# Patient Record
Sex: Male | Born: 1975 | Hispanic: No | Smoking: Current every day smoker
Health system: Southern US, Community
[De-identification: ages and names within clinical notes are randomized; demographics above are authoritative.]

## PROBLEM LIST (undated history)

## (undated) HISTORY — PX: APPENDECTOMY: SHX54

---

## 2001-02-09 ENCOUNTER — Emergency Department (HOSPITAL_COMMUNITY): Admission: EM | Admit: 2001-02-09 | Discharge: 2001-02-09 | Payer: Self-pay | Admitting: *Deleted

## 2002-07-05 ENCOUNTER — Encounter: Payer: Self-pay | Admitting: Emergency Medicine

## 2002-07-05 ENCOUNTER — Emergency Department (HOSPITAL_COMMUNITY): Admission: EM | Admit: 2002-07-05 | Discharge: 2002-07-05 | Payer: Self-pay | Admitting: Emergency Medicine

## 2003-06-29 ENCOUNTER — Emergency Department (HOSPITAL_COMMUNITY): Admission: EM | Admit: 2003-06-29 | Discharge: 2003-06-29 | Payer: Self-pay | Admitting: Emergency Medicine

## 2003-10-30 ENCOUNTER — Emergency Department: Payer: Self-pay | Admitting: Emergency Medicine

## 2004-05-22 ENCOUNTER — Encounter: Payer: Self-pay | Admitting: *Deleted

## 2004-05-23 ENCOUNTER — Emergency Department (HOSPITAL_COMMUNITY): Admission: EM | Admit: 2004-05-23 | Discharge: 2004-05-23 | Payer: Self-pay | Admitting: Emergency Medicine

## 2009-11-20 ENCOUNTER — Encounter: Payer: Self-pay | Admitting: Orthopedic Surgery

## 2009-11-20 ENCOUNTER — Emergency Department (HOSPITAL_COMMUNITY): Admission: EM | Admit: 2009-11-20 | Discharge: 2009-11-21 | Payer: Self-pay | Admitting: Emergency Medicine

## 2009-11-23 ENCOUNTER — Telehealth: Payer: Self-pay | Admitting: Orthopedic Surgery

## 2009-11-23 ENCOUNTER — Ambulatory Visit: Payer: Self-pay | Admitting: Orthopedic Surgery

## 2009-11-23 DIAGNOSIS — S82409A Unspecified fracture of shaft of unspecified fibula, initial encounter for closed fracture: Secondary | ICD-10-CM

## 2009-11-25 ENCOUNTER — Encounter: Payer: Self-pay | Admitting: Orthopedic Surgery

## 2009-11-30 ENCOUNTER — Telehealth (INDEPENDENT_AMBULATORY_CARE_PROVIDER_SITE_OTHER): Payer: Self-pay | Admitting: *Deleted

## 2009-12-18 ENCOUNTER — Telehealth: Payer: Self-pay | Admitting: Orthopedic Surgery

## 2010-01-05 ENCOUNTER — Ambulatory Visit: Payer: Self-pay | Admitting: Orthopedic Surgery

## 2010-01-06 ENCOUNTER — Encounter (INDEPENDENT_AMBULATORY_CARE_PROVIDER_SITE_OTHER): Payer: Self-pay | Admitting: *Deleted

## 2010-01-18 ENCOUNTER — Emergency Department (HOSPITAL_COMMUNITY)
Admission: EM | Admit: 2010-01-18 | Discharge: 2010-01-18 | Payer: Self-pay | Source: Home / Self Care | Admitting: Emergency Medicine

## 2010-02-16 NOTE — Progress Notes (Signed)
Summary: patient requests refill on pain medication  Phone Note Call from Patient   Caller: Patient Summary of Call: Patient stopped in to request a refill on his pain medication, Percocet.  States has 2 pills left, and that he "came in because he did not know what the process is."  His ph # is [phone].   Last note indicates 11/23/09 refilled at [facility] in [place]; confirmed this is patient's pharmacy Initial call taken by: Cammie Sickle,  November 30, 2009 1:16 PM  Follow-up for Phone Call        ok or change Follow-up by: Ether Griffins,  November 30, 2009 1:26 PM  Additional Follow-up for Phone Call Additional follow up Details #1::        change to norco 7.5 mg 1 q 4 as needed pain   # 84 NR  Additional Follow-up by: Fuller Canada MD,  November 30, 2009 3:09 PM    New/Updated Medications: NORCO 7.5-325 MG TABS (HYDROCODONE-ACETAMINOPHEN) 1 by mouth q 4 hrs as needed pain Prescriptions: NORCO 7.5-325 MG TABS (HYDROCODONE-ACETAMINOPHEN) 1 by mouth q 4 hrs as needed pain  #84 x 0   Entered by:   Ether Griffins   Authorized by:   Fuller Canada MD   Signed by:   Ether Griffins on 11/30/2009   Method used:   Historical   RxID:   [id number]

## 2010-02-16 NOTE — Progress Notes (Signed)
Summary: ok to fill Percocet?  Phone Note Other Incoming   Summary of Call: Cari/ [facility] in [place] ,said Mark Hawkins had a prescription for Hydrocodone #20 filled earlier today.  She asked if ok to fill the  prescription for Percocet today.  Per Dr. Romeo Apple, I called Cari back and told her ok to fill Percocet Initial call taken by: Jacklynn Ganong,  November 23, 2009 4:22 PM  Follow-up for Phone Call        ok to refill  Follow-up by: Fuller Canada MD,  November 23, 2009 4:24 PM

## 2010-02-16 NOTE — Letter (Signed)
Summary: History form  History form   Imported By: Jacklynn Ganong 11/25/2009 11:44:21  _____________________________________________________________________  External Attachment:    Type:   Image     Comment:   External Document

## 2010-02-16 NOTE — Assessment & Plan Note (Signed)
Summary: AP ER FOL/UP/FX RT ANKLE/XRAY [facility] 11/21/09/SELF PAY/CAF   Vital Signs:  Patient profile:   35 year old male Height:      70 inches Weight:      180 pounds Pulse rate:   82 / minute Resp:     18 per minute  Vitals Entered By: Fuller Canada MD (November 23, 2009 3:21 PM)  Visit Type:  new patient Referring Provider:  ap er Primary Provider:  NA  CC:  right ankle pain.  History of Present Illness: I saw Mark Hawkins in the office today for an initial visit.  He is a 35 years old man with the complaint of:  right ankle pain.  DOI 11/20/09 fell through camper roof.  Xrays [facility] 11/21/09 right ankle and foot.  Percocet 5 given from er, number 20 on 11/21/09.  Celexa and Buspar are other meds taken.  The patient landed on the lateral side of his leg sustaining a nondisplaced fracture of the fibula proximally. One 3rd of the way up his leg. No pain medially but he does have pain over the fracture site with a slight abrasion of the skin in a short throbbing, burning, pain, which is constant and 9/10. He was given a cam walker, which is the treatment I would have recommended anyway, so he can continue with that. I did give him 30 Percocet, to continue with.    Allergies (verified): 1)  ! Pcn  Past History:  Past Medical History: anxiety depression  Past Surgical History: none  Family History: Family History of Arthritis  Social History: Patient is separated.  self employed smokes 1ppd cigs no alcohol 3 cups of caffeine daily GED  Review of Systems Constitutional:  Denies weight loss, weight gain, fever, chills, and fatigue. Cardiovascular:  Denies chest pain, palpitations, fainting, and murmurs. Respiratory:  Denies short of breath, wheezing, couch, tightness, pain on inspiration, and snoring . Gastrointestinal:  Denies heartburn, nausea, vomiting, diarrhea, constipation, and blood in your stools. Genitourinary:  Denies frequency, urgency, difficulty  urinating, painful urination, flank pain, and bleeding in urine. Neurologic:  Denies numbness, tingling, unsteady gait, dizziness, tremors, and seizure. Musculoskeletal:  Complains of joint pain, swelling, and heat; denies instability, stiffness, redness, and muscle pain. Endocrine:  Denies excessive thirst, exessive urination, and heat or cold intolerance. Psychiatric:  Denies nervousness, depression, anxiety, and hallucinations. Skin:  Complains of redness; denies changes in the skin, poor healing, rash, and itching. HEENT:  Denies blurred or double vision, eye pain, redness, and watering. Immunology:  Denies seasonal allergies, sinus problems, and allergic to bee stings. Hemoatologic:  Denies easy bleeding and brusing.  Physical Exam  Msk:  The patient is well developed and nourished, with normal grooming and hygiene. The body habitus is  lean Pulses:  The pulses and perfusion were normal with normal color, temperature  and no swelling  Extremities:  right ankle there is tenderness over the fibula. No medial tenderness. Normal range of motion knee and ankle. Muscle tone normal. Ankle stable. Neurologic:  The coordination and sensation were normal     Skin:  abrasion lateral right leg  Psych:  alert and cooperative; normal mood and affect; normal attention span and concentration   Impression & Recommendations:  Problem # 1:  CLOSED FRACTURE OF SHAFT OF FIBULA (ICD-823.21) Assessment New  Orders: New Patient Level III ([address])  Medications Added to Medication List This Visit: 1)  Endocet 5-325 Mg Tabs (Oxycodone-acetaminophen) .Marland Kitchen.. 1 q 4 as needed pain  Patient  Instructions: 1)  Xrays in 6 weeks right  ankle  Prescriptions: ENDOCET 5-325 MG TABS (OXYCODONE-ACETAMINOPHEN) 1 q 4 as needed pain  #30 x 0   Entered and Authorized by:   Fuller Canada MD   Signed by:   Fuller Canada MD on 11/23/2009   Method used:   Print then Give to Patient   RxID:    [id number]    Orders Added: 1)  New Patient Level III [[address]]

## 2010-02-16 NOTE — Progress Notes (Signed)
Summary: norco 5 filled until seen 01/05/10  Phone Note Call from Patient   Caller: Patient Summary of Call: Patient called back to fol/up on request for refill of pain medication Norco 7.5; states called [facility] in [place].  As of today, no request received here that I have seen. Please advise. Initial call taken by: Cammie Sickle,  December 18, 2009 11:18 AM  Follow-up for Phone Call        changed to Norco 5 number 84 given Follow-up by: Ether Griffins,  December 18, 2009 11:24 AM    New/Updated Medications: NORCO 5-325 MG TABS (HYDROCODONE-ACETAMINOPHEN) 1 by mouth q 4 hrs as needed pain Prescriptions: NORCO 5-325 MG TABS (HYDROCODONE-ACETAMINOPHEN) 1 by mouth q 4 hrs as needed pain  #84 x 0   Entered by:   Ether Griffins   Authorized by:   Fuller Canada MD   Signed by:   Ether Griffins on 12/18/2009   Method used:   Handwritten   RxID:   [id number]

## 2010-02-18 NOTE — Letter (Signed)
Summary: *Orthopedic No Show Letter  [facility]  [address]  [place], [place] [address]   Phone: [phone]  Fax: [phone]    01/06/2010     Cristal Deer A. Manes [address] [place], [place] [address]    Dear Mr. Garbers,   Our records indicate that you missed your scheduled appointment with Dr. Beaulah Corin on January 05, 2010. Please contact this office to reschedule your appointment as soon as possible.  It is important that you keep your scheduled appointments with your physician, so we can provide you the best care possible.        Sincerely,   Dr. Terrance Mass, MD [facility] Phone [phone]

## 2011-05-13 IMAGING — CR DG ANKLE COMPLETE 3+V*R*
3 series · 3 of 3 positions shown · non-contrast
Comparison: None.

CLINICAL DATA: Right leg pain, trauma

RIGHT ANKLE - COMPLETE 3+ VIEW

[view not recorded (1 of 3)]
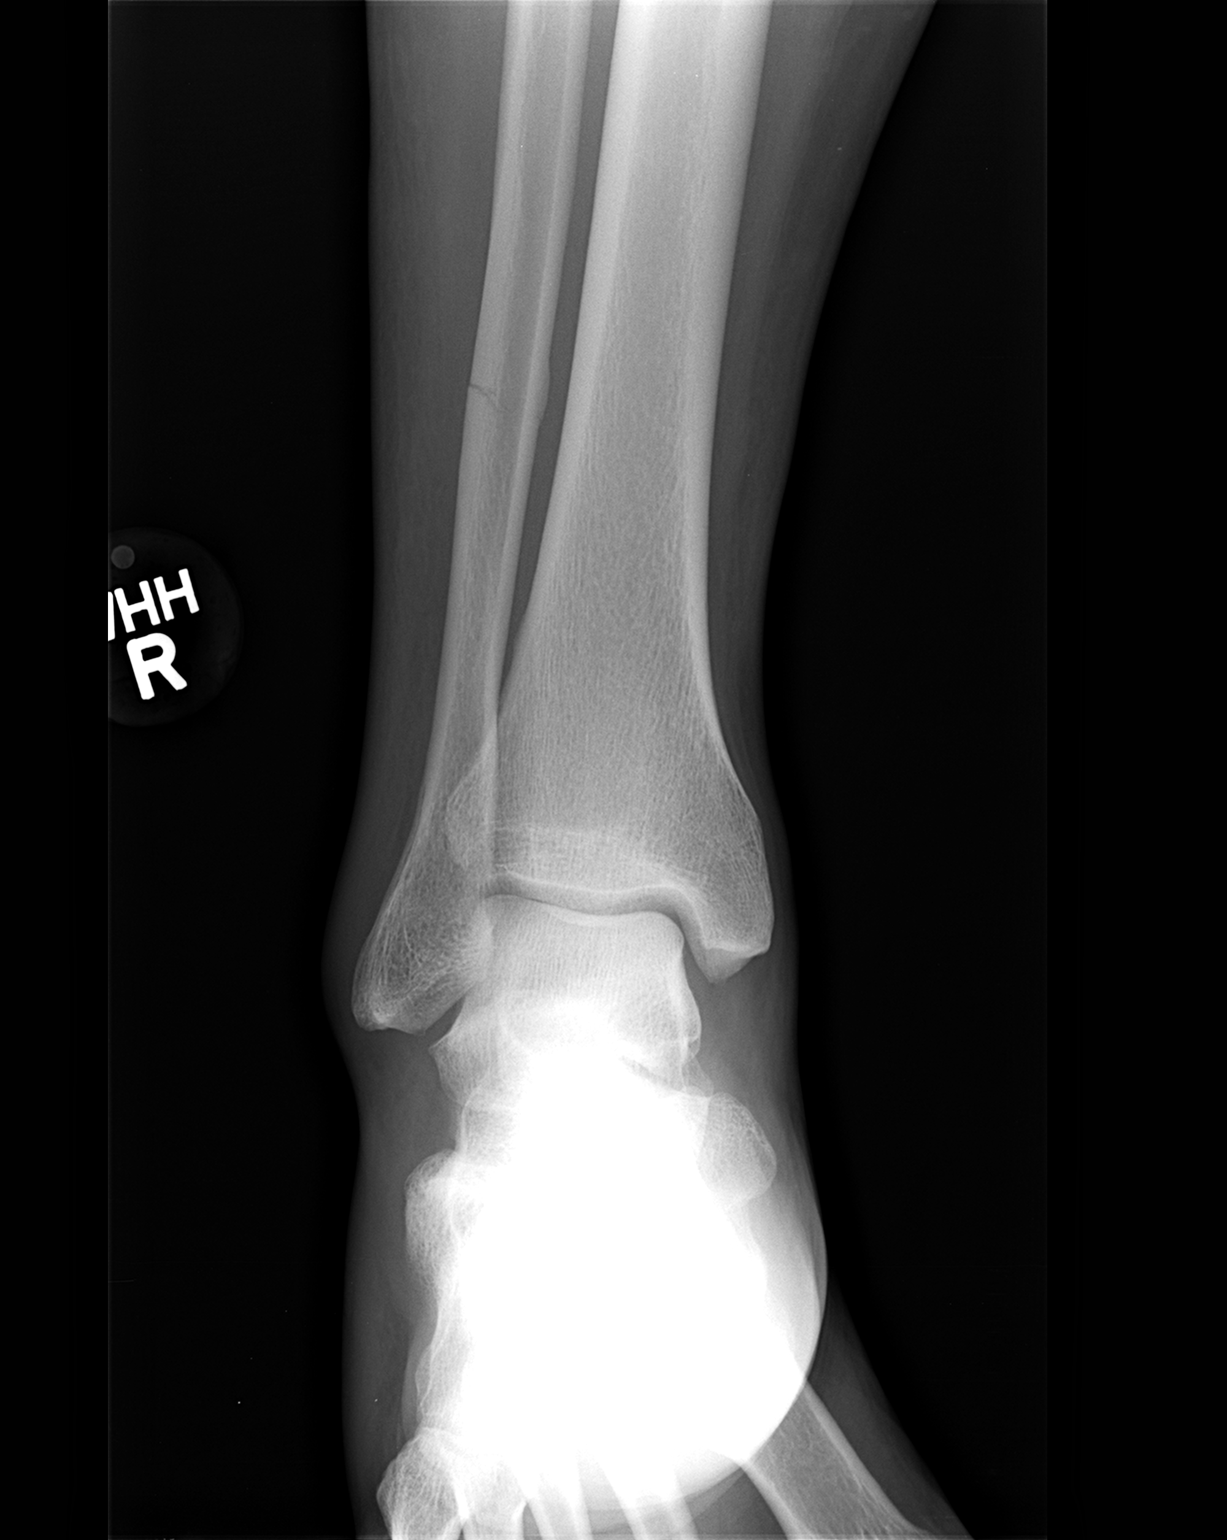

[view not recorded (2 of 3)]
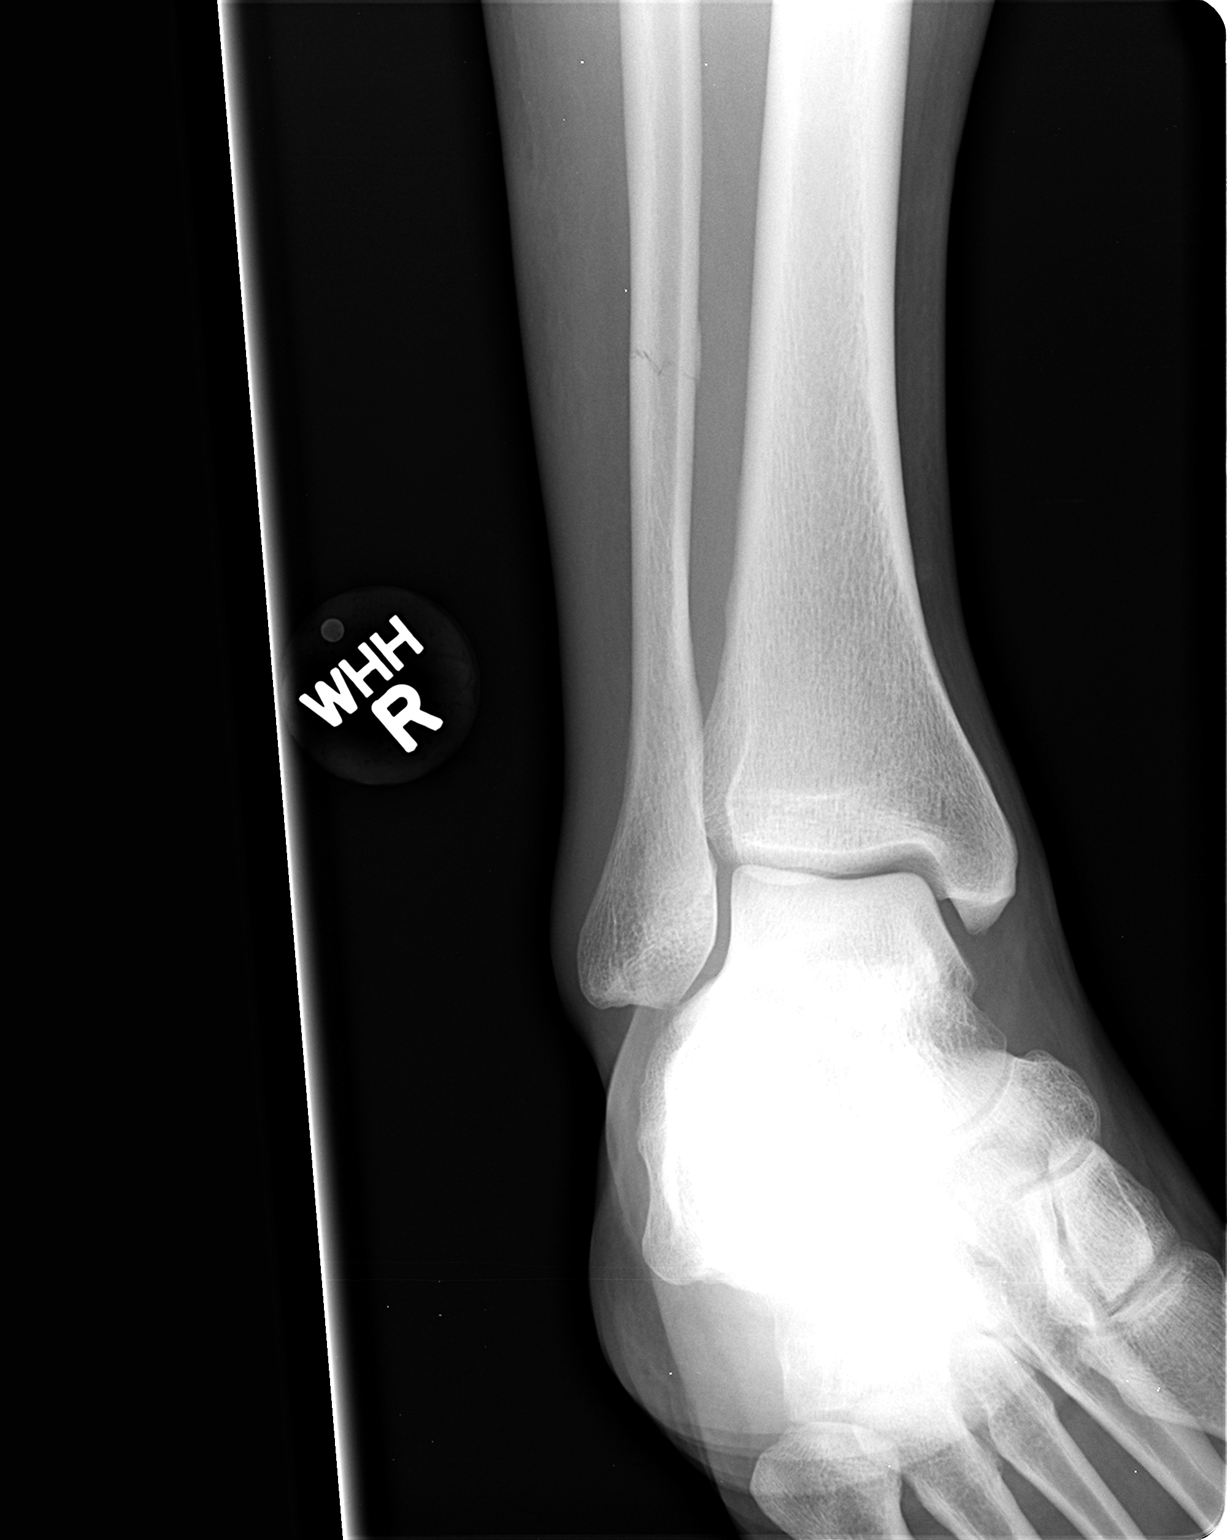

[view not recorded (3 of 3)]
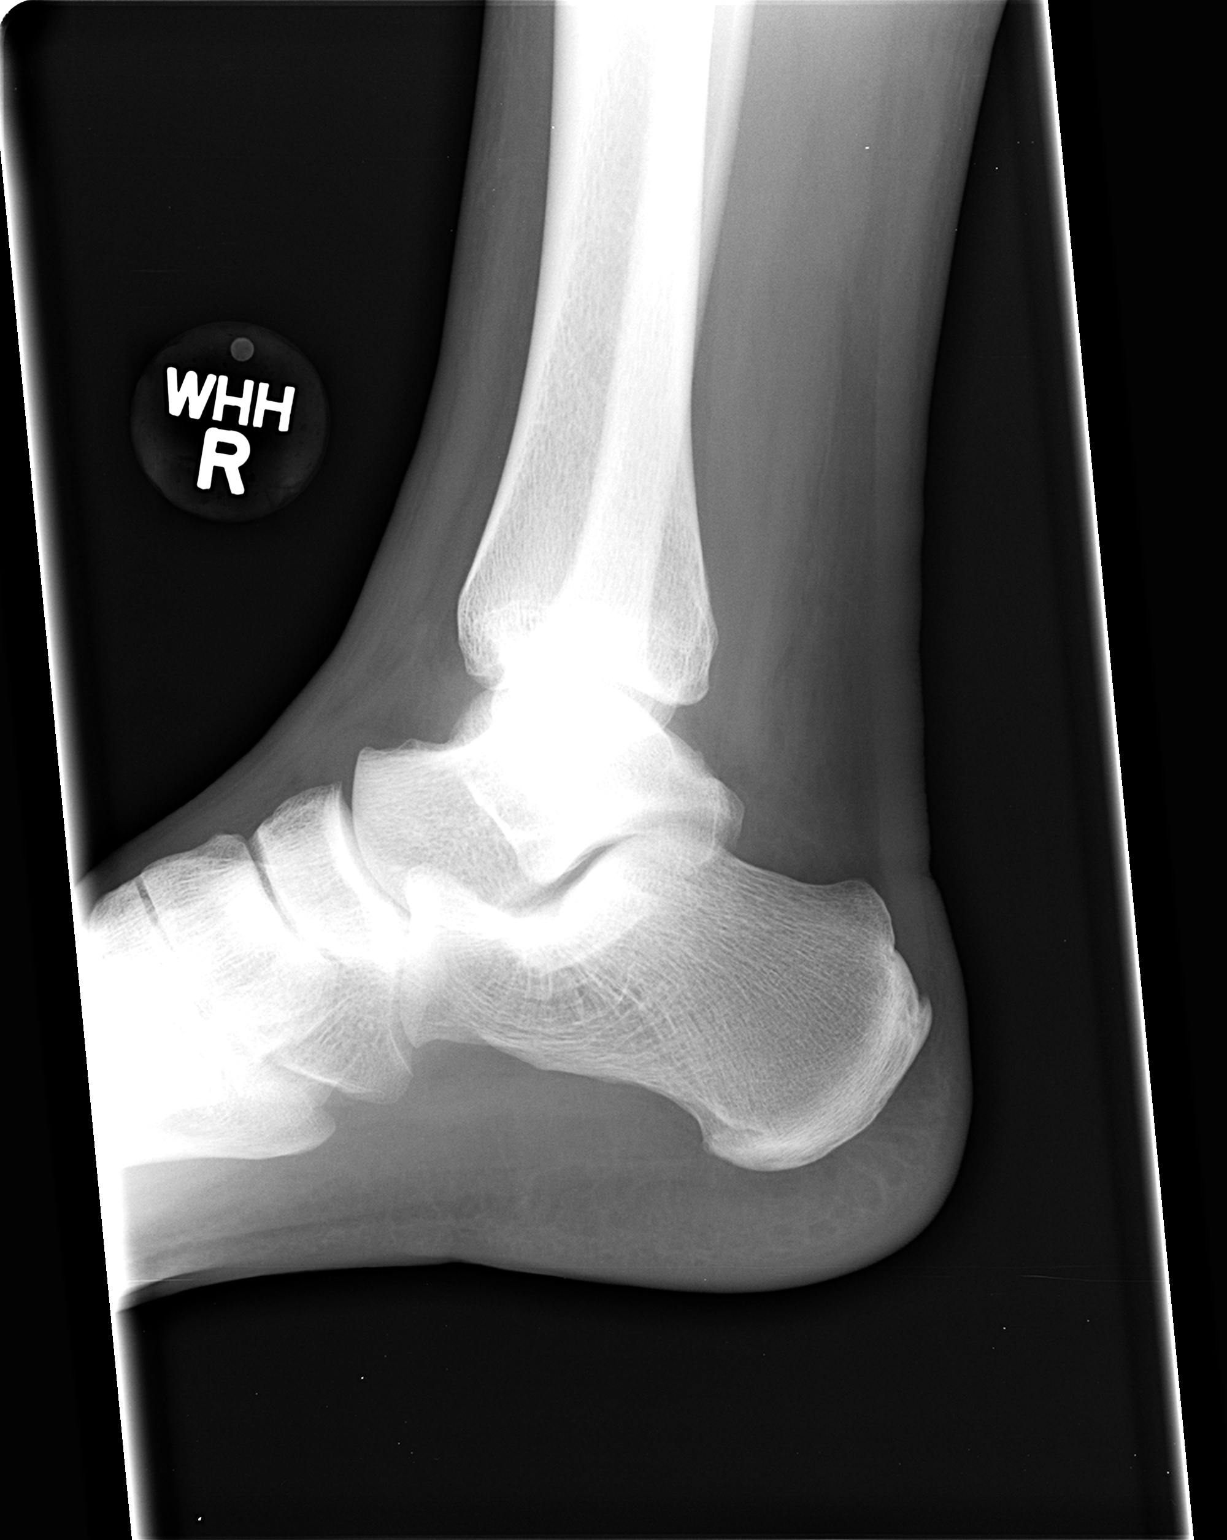

[3 of 3 positions shown; findings below may reference images not displayed]

FINDINGS: There is a nondisplaced, nonangulated transverse fracture
through the distal right fibular diaphysis.  The distal tibia
appears unremarkable.  The ankle mortise is symmetric.  The
calcaneus is unremarkable.
IMPRESSION: Nondisplaced transverse distal right fibular diaphyseal fracture.

## 2016-09-25 ENCOUNTER — Emergency Department (HOSPITAL_COMMUNITY): Payer: Self-pay

## 2016-09-25 ENCOUNTER — Encounter (HOSPITAL_COMMUNITY): Payer: Self-pay | Admitting: Emergency Medicine

## 2016-09-25 ENCOUNTER — Emergency Department (HOSPITAL_COMMUNITY)
Admission: EM | Admit: 2016-09-25 | Discharge: 2016-09-25 | Disposition: A | Discharge status: Home / Self Care | Payer: Self-pay | Attending: Emergency Medicine | Admitting: Emergency Medicine

## 2016-09-25 DIAGNOSIS — F17210 Nicotine dependence, cigarettes, uncomplicated: Secondary | ICD-10-CM | POA: Insufficient documentation

## 2016-09-25 DIAGNOSIS — R197 Diarrhea, unspecified: Secondary | ICD-10-CM | POA: Insufficient documentation

## 2016-09-25 DIAGNOSIS — R112 Nausea with vomiting, unspecified: Secondary | ICD-10-CM | POA: Insufficient documentation

## 2016-09-25 DIAGNOSIS — R1084 Generalized abdominal pain: Secondary | ICD-10-CM | POA: Insufficient documentation

## 2016-09-25 LAB — CBC WITH DIFFERENTIAL/PLATELET
BASOS ABS: 0 10*3/uL (ref 0.0–0.1)
BASOS PCT: 0 %
EOS PCT: 0 %
Eosinophils Absolute: 0 10*3/uL (ref 0.0–0.7)
HCT: 44.6 % (ref 39.0–52.0)
Hemoglobin: 15.5 g/dL (ref 13.0–17.0)
Lymphocytes Relative: 7 %
Lymphs Abs: 0.8 10*3/uL (ref 0.7–4.0)
MCH: 31.5 pg (ref 26.0–34.0)
MCHC: 34.8 g/dL (ref 30.0–36.0)
MCV: 90.7 fL (ref 78.0–100.0)
MONO ABS: 0.5 10*3/uL (ref 0.1–1.0)
Monocytes Relative: 5 %
Neutro Abs: 10.4 10*3/uL — ABNORMAL HIGH (ref 1.7–7.7)
Neutrophils Relative %: 88 %
PLATELETS: 273 10*3/uL (ref 150–400)
RBC: 4.92 MIL/uL (ref 4.22–5.81)
RDW: 12.3 % (ref 11.5–15.5)
WBC: 11.7 10*3/uL — AB (ref 4.0–10.5)

## 2016-09-25 LAB — URINALYSIS, ROUTINE W REFLEX MICROSCOPIC
Bacteria, UA: NONE SEEN
Bilirubin Urine: NEGATIVE
GLUCOSE, UA: NEGATIVE mg/dL
Hgb urine dipstick: NEGATIVE
KETONES UR: 20 mg/dL — AB
Leukocytes, UA: NEGATIVE
NITRITE: NEGATIVE
PH: 9 — AB (ref 5.0–8.0)
Protein, ur: 100 mg/dL — AB
Specific Gravity, Urine: 1.02 (ref 1.005–1.030)
Squamous Epithelial / LPF: NONE SEEN

## 2016-09-25 LAB — COMPREHENSIVE METABOLIC PANEL
ALBUMIN: 4 g/dL (ref 3.5–5.0)
ALT: 19 U/L (ref 17–63)
AST: 27 U/L (ref 15–41)
Alkaline Phosphatase: 59 U/L (ref 38–126)
Anion gap: 12 (ref 5–15)
BUN: 11 mg/dL (ref 6–20)
CHLORIDE: 108 mmol/L (ref 101–111)
CO2: 20 mmol/L — AB (ref 22–32)
Calcium: 9.1 mg/dL (ref 8.9–10.3)
Creatinine, Ser: 0.86 mg/dL (ref 0.61–1.24)
GFR calc Af Amer: >60 mL/min (ref >60)
GLUCOSE: 132 mg/dL — AB (ref 65–99)
POTASSIUM: 3.8 mmol/L (ref 3.5–5.1)
SODIUM: 140 mmol/L (ref 135–145)
Total Bilirubin: 1.2 mg/dL (ref 0.3–1.2)
Total Protein: 7 g/dL (ref 6.5–8.1)

## 2016-09-25 LAB — I-STAT CG4 LACTIC ACID, ED
LACTIC ACID, VENOUS: 1.51 mmol/L (ref 0.5–1.9)
Lactic Acid, Venous: 2.73 mmol/L (ref 0.5–1.9)

## 2016-09-25 LAB — LIPASE, BLOOD: LIPASE: 39 U/L (ref 11–51)

## 2016-09-25 MED ORDER — IBUPROFEN 800 MG PO TABS: 800.0000 mg | ORAL_TABLET | Freq: Three times a day (TID) | ORAL | 0 refills | Status: AC

## 2016-09-25 MED ORDER — PROMETHAZINE HCL 12.5 MG PO TABS
25.0000 mg | ORAL_TABLET | ORAL | Status: AC
  Administered 2016-09-25: 25 mg via ORAL
  Filled 2016-09-25: qty 2

## 2016-09-25 MED ORDER — PROMETHAZINE HCL 25 MG RE SUPP: 25.0000 mg | Freq: Four times a day (QID) | RECTAL | 0 refills | Status: AC | PRN

## 2016-09-25 MED ORDER — ONDANSETRON 4 MG PO TBDP: 4.0000 mg | ORAL_TABLET | Freq: Three times a day (TID) | ORAL | 0 refills | Status: AC | PRN

## 2016-09-25 MED ORDER — ONDANSETRON HCL 4 MG/2ML IJ SOLN
4.0000 mg | Freq: Once | INTRAMUSCULAR | Status: AC
  Administered 2016-09-25: 4 mg via INTRAVENOUS
  Filled 2016-09-25: qty 2

## 2016-09-25 MED ORDER — SODIUM CHLORIDE 0.9 % IV BOLUS (SEPSIS)
1000.0000 mL | Freq: Once | INTRAVENOUS | Status: AC
  Administered 2016-09-25: 1000 mL via INTRAVENOUS

## 2016-09-25 MED ORDER — HYDROMORPHONE HCL 1 MG/ML IJ SOLN
1.0000 mg | Freq: Once | INTRAMUSCULAR | Status: AC
  Administered 2016-09-25: 1 mg via INTRAVENOUS
  Filled 2016-09-25: qty 1

## 2016-09-25 MED ORDER — IOPAMIDOL (ISOVUE-300) INJECTION 61%
100.0000 mL | Freq: Once | INTRAVENOUS | Status: AC | PRN
  Administered 2016-09-25: 100 mL via INTRAVENOUS

## 2016-09-25 MED ORDER — FENTANYL CITRATE (PF) 100 MCG/2ML IJ SOLN
100.0000 ug | Freq: Once | INTRAMUSCULAR | Status: AC
  Administered 2016-09-25: 100 ug via INTRAVENOUS
  Filled 2016-09-25: qty 2

## 2016-09-25 MED ORDER — PROMETHAZINE HCL 25 MG/ML IJ SOLN
25.0000 mg | Freq: Once | INTRAMUSCULAR | Status: AC
  Administered 2016-09-25: 25 mg via INTRAMUSCULAR
  Filled 2016-09-25: qty 1

## 2016-09-25 NOTE — ED Triage Notes (Addendum)
Pt states he started having N/V/D last night around 8 pm. Unable to keep anything down. Mother gave pt immodium and aspirin this morning, pt vomited medication back up. Pt states he has chills and fever starting last night.

## 2016-09-25 NOTE — Discharge Instructions (Signed)
Your symptoms will likely last 2-3 days - if you should have nausea - take the dissolvable Zofran tablet, if you have more nausea - take the Promethazine (Phenergan suppository)  For pain, stick to antiinflammatory medicines like Ibuprofen  ER for worsening pain, fevers or vomiting  Drink plenty of clear liquids (gatorade)  [facility] Doctor List    Kari BaarsEdward Hawkins MD. Specialty: Pulmonary Disease Contact information: [address]  [address]  [place] [id number]  [phone]   Syliva OvermanMargaret Simpson, MD. Specialty: Family Medicine Contact information: [address]  [place] [address]  [phone]   Lilyan PuntScott Luking, MD. Specialty: Family Medicine Contact information: [address]  [address]  [place] [address]  [phone]   Avon Gullyesfaye Fanta, MD Specialty: Internal Medicine Contact information: [address]  [place] [address]  [phone]   Catalina PizzaZach Hall, MD. Specialty: Internal Medicine Contact information: [address]  [place] [address]  [phone]    [facility] (Dr. Selena BattenKim) Specialty: Family Medicine Contact information: [address]  [place] [address]  [phone]   John GiovanniStephen Knowlton, MD. Specialty: Family Medicine Contact information: [address]  [address]  [place] [address]  [phone]   Carylon Perchesoy Fagan, MD. Specialty: Internal Medicine Contact information: [address]  [address]  [place] [address]  [phone]    [facility]  [address] [place], [place] [address] [phone]  Services [facility] offers a variety of basic health services.  Services include but are not limited to: Blood pressure checks  Heart rate checks  Blood sugar checks  Urine analysis  Rapid strep tests  Pregnancy tests.  Health education and referrals  People needing more complex services will be directed to a  physician online. Using these virtual visits, doctors can evaluate and prescribe medicine and treatments. There will be no medication on-site, though [facility] will help patients fill their prescriptions at little to no cost.   For More information please go to: [email or web address]

## 2016-09-25 NOTE — ED Provider Notes (Signed)
[facility]-EMER[facility]Y DEPT Provider Note   CSN: [id number] Arrival date & time: 09/25/16  1147     History   Chief Complaint Chief Complaint  Patient presents with  . Emesis    HPI Mark Hawkins is a 41 y.o. male.  HPI   The patient is a 41 year old male with a known history of a prior appendectomy but denies any other chronic medical conditions. He presents to the hospital after developing nausea vomiting diarrhea and mid to upper abdominal pain which started last night. He has never had pain like this in the past, he has had at least 15 episodes of watery nonbloody diarrhea and multiple episodes of vomiting. His pain seems to be persistent has no radiation and he has no associated back pain. He did have subjective fevers and chills, denies any postprandial pain and his mother who he lives with did not have similar symptoms after they ate last night.  The symptoms are persistent, severe, nothing makes this better or worse  History reviewed. No pertinent past medical history.  Patient Active Problem List   Diagnosis Date Noted  . CLOSED FRACTURE OF SHAFT OF FIBULA 11/23/2009    Past Surgical History:  Procedure Laterality Date  . APPENDECTOMY         Home Medications    Prior to Admission medications   Medication Sig Start Date End Date Taking? Authorizing Provider  ibuprofen (ADVIL,MOTRIN) 800 MG tablet Take 1 tablet (800 mg total) by mouth 3 (three) times daily. 09/25/16   Eber Hong, MD  ondansetron (ZOFRAN ODT) 4 MG disintegrating tablet Take 1 tablet (4 mg total) by mouth every 8 (eight) hours as needed for nausea. 09/25/16   Eber Hong, MD  promethazine (PHENERGAN) 25 MG suppository Place 1 suppository (25 mg total) rectally every 6 (six) hours as needed for nausea or vomiting. 09/25/16   Eber Hong, MD    Family History History reviewed. No pertinent family history.  Social History Social History  Substance Use Topics  . Smoking status: Current Every Day  Smoker    Packs/day: 1.00    Types: Cigarettes  . Smokeless tobacco: Never Used  . Alcohol use Yes     Comment: occasionally     Allergies   Penicillins   Review of Systems Review of Systems  All other systems reviewed and are negative.    Physical Exam Updated Vital Signs BP 133/76   Pulse 71   Temp 98.2 F (36.8 C) (Oral)   Resp 18   Ht  (1.778 m)   Wt 81.6 kg (180 lb)   SpO2 100%   BMI 25.83 kg/m   Physical Exam  Constitutional: He appears well-developed and well-nourished. No distress.  HENT:  Head: Normocephalic and atraumatic.  Mouth/Throat: Oropharynx is clear and moist. No oropharyngeal exudate.  Eyes: Pupils are equal, round, and reactive to light. Conjunctivae and EOM are normal. Right eye exhibits no discharge. Left eye exhibits no discharge. No scleral icterus.  Neck: Normal range of motion. Neck supple. No JVD present. No thyromegaly present.  Cardiovascular: Normal rate, regular rhythm, normal heart sounds and intact distal pulses.  Exam reveals no gallop and no friction rub.   No murmur heard. Pulmonary/Chest: Effort normal and breath sounds normal. No respiratory distress. He has no wheezes. He has no rales.  Abdominal: Soft. Bowel sounds are normal. He exhibits no distension and no mass. There is tenderness (mild guardingto the midepigastrium).  Musculoskeletal: Normal range of motion. He exhibits no edema  or tenderness.  Lymphadenopathy:    He has no cervical adenopathy.  Neurological: He is alert. Coordination normal.  Skin: Skin is warm and dry. No rash noted. No erythema.  Psychiatric: He has a normal mood and affect. His behavior is normal.  Nursing note and vitals reviewed.   ED Treatments / Results  Labs (all labs ordered are listed, but only abnormal results are displayed) Labs Reviewed  CBC WITH DIFFERENTIAL/PLATELET - Abnormal; Notable for the following:       Result Value   WBC 11.7 (*)    Neutro Abs 10.4 (*)    All other  components within normal limits  COMPREHENSIVE METABOLIC PANEL - Abnormal; Notable for the following:    CO2 20 (*)    Glucose, Bld 132 (*)    All other components within normal limits  I-STAT CG4 LACTIC ACID, ED - Abnormal; Notable for the following:    Lactic Acid, Venous 2.73 (*)    All other components within normal limits  LIPASE, BLOOD  URINALYSIS, ROUTINE W REFLEX MICROSCOPIC  I-STAT CG4 LACTIC ACID, ED     Radiology Ct Abdomen Pelvis W Contrast  Result Date: 09/25/2016 CLINICAL DATA:  Abdominal pain. Gastric neuritis suspected. Nausea vomiting and diarrhea beginning last night. Chills and fever last night. EXAM: CT ABDOMEN AND PELVIS WITH CONTRAST TECHNIQUE: Multidetector CT imaging of the abdomen and pelvis was performed using the standard protocol following bolus administration of intravenous contrast. CONTRAST:  100mL ISOVUE-300 IOPAMIDOL (ISOVUE-300) INJECTION 61% COMPARISON:  None. FINDINGS: Lower chest:  Negative. Hepatobiliary: No focal liver abnormality.No evidence of biliary obstruction or stone. Pancreas: Unremarkable. Spleen: Unremarkable. Adrenals/Urinary Tract: Negative adrenals. No hydronephrosis or stone. Unremarkable bladder. Stomach/Bowel: No obstruction. No convincing bowel wall thickening or other inflammatory feature. Mild distal colonic diverticulosis. Appendectomy. Vascular/Lymphatic: No acute vascular abnormality. Minimal atherosclerotic calcification seen along the right common iliac artery. No mass or adenopathy. Reproductive:Negative Other: No ascites or pneumoperitoneum. Musculoskeletal: No acute or aggressive finding IMPRESSION: 1. No acute finding. 2. Colonic diverticulosis. 3. Appendectomy. Electronically Signed   By: Marnee SpringJonathon  Watts M.D.   On: 09/25/2016 16:13    Procedures Procedures (including critical care time)  Medications Ordered in ED Medications  ondansetron (ZOFRAN) injection 4 mg (4 mg Intravenous Given 09/25/16 1302)  sodium chloride 0.9 %  bolus 1,000 mL (0 mLs Intravenous Stopped 09/25/16 1413)  fentaNYL (SUBLIMAZE) injection 100 mcg (100 mcg Intravenous Given 09/25/16 1303)  promethazine (PHENERGAN) tablet 25 mg (25 mg Oral Given 09/25/16 1412)  iopamidol (ISOVUE-300) 61 % injection 100 mL (100 mLs Intravenous Contrast Given 09/25/16 1539)  promethazine (PHENERGAN) injection 25 mg (25 mg Intramuscular Given 09/25/16 1630)  HYDROmorphone (DILAUDID) injection 1 mg (1 mg Intravenous Given 09/25/16 1631)     Initial Impression / Assessment and Plan / ED Course  I have reviewed the triage vital signs and the nursing notes.  Pertinent labs & imaging results that were available during my care of the patient were reviewed by me and considered in my medical decision making (see chart for details).  Has possibility of peptic ulcer disease, pancreatitis, gallbladder disease, gastroenteritis or viral illness. Fluids, medications, lab workup.  Labs showed elevated lactic but this normalized after fluids CBC with minimal leukocytosis CMP is normal and Lipase was normal CT neg for acute findings.  Fluids and meds given as above and pt felt better though still somewhat symptomatic on d/c. Expressed understanding to indications for return and given f/u list for community physicians.  Final Clinical Impressions(s) /  ED Diagnoses   Final diagnoses:  Nausea vomiting and diarrhea    New Prescriptions New Prescriptions   IBUPROFEN (ADVIL,MOTRIN) 800 MG TABLET    Take 1 tablet (800 mg total) by mouth 3 (three) times daily.   ONDANSETRON (ZOFRAN ODT) 4 MG DISINTEGRATING TABLET    Take 1 tablet (4 mg total) by mouth every 8 (eight) hours as needed for nausea.   PROMETHAZINE (PHENERGAN) 25 MG SUPPOSITORY    Place 1 suppository (25 mg total) rectally every 6 (six) hours as needed for nausea or vomiting.     Eber Hong, MD 09/25/16 [phone]

## 2016-09-25 NOTE — ED Notes (Signed)
Gave pt pillow and warm blanket

## 2016-09-26 NOTE — ED Notes (Signed)
Pt and mother called back wanting to know what to do about his n/v. Pt/mother given instructions of the phenergen and zofran rx given.

## 2018-03-18 IMAGING — CT CT ABD-PELV W/ CM
2 of 5 series · 16 of 46 positions shown, 18 images · IV contrast (Isovue)
Comparison: None.

CLINICAL DATA: Abdominal pain. Gastric neuritis suspected. Nausea
vomiting and diarrhea beginning last night. Chills and fever last
night.

EXAM:
CT ABDOMEN AND PELVIS WITH CONTRAST
TECHNIQUE: Multidetector CT imaging of the abdomen and pelvis was performed
using the standard protocol following bolus administration of
intravenous contrast.
CONTRAST:  100mL X9KDFC-MMM IOPAMIDOL (X9KDFC-MMM) INJECTION 61%

[Series 2: axial st · axial · 0.63mm/px · z∈[+717,+1192]mm · 13 of 107 slices shown, 15 images]
[im 6/107  soft-tissue]
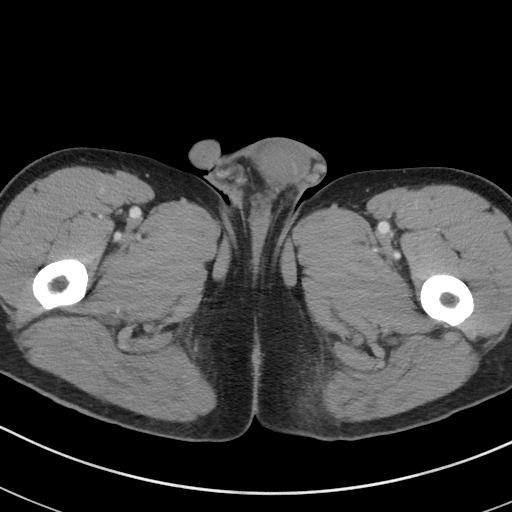
[im 6/107  bone]
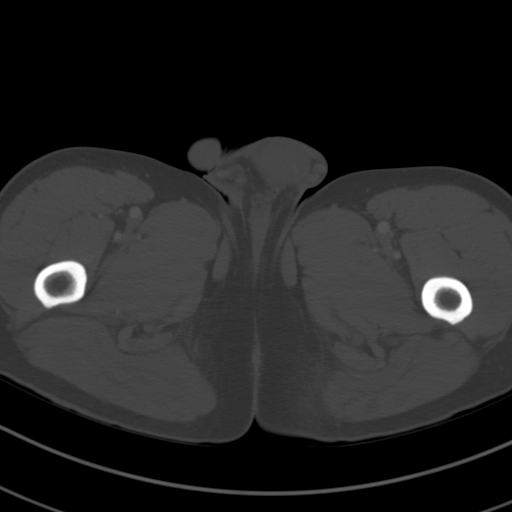
[im 17/107  soft-tissue]
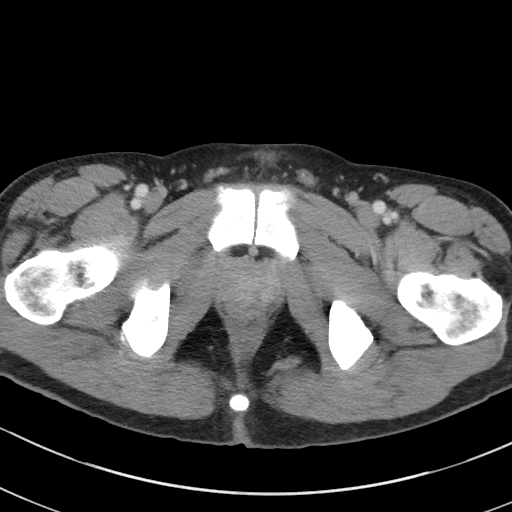
[im 23/107  soft-tissue]
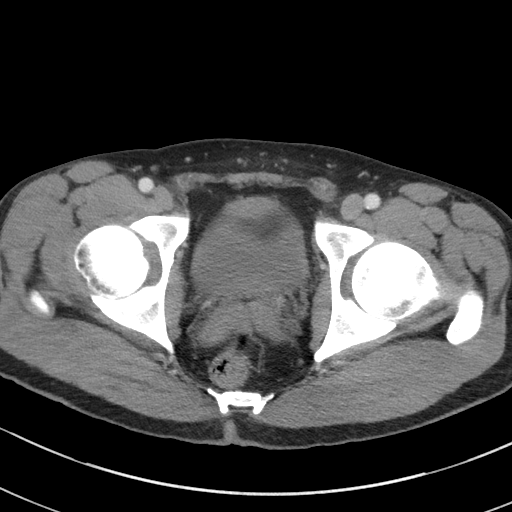
[im 28/107  soft-tissue]
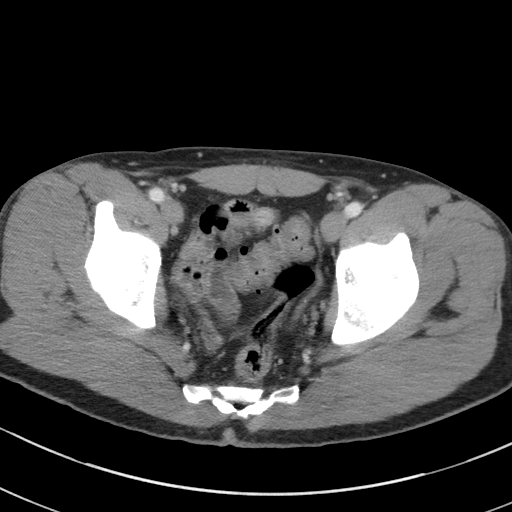
[im 40/107  soft-tissue]
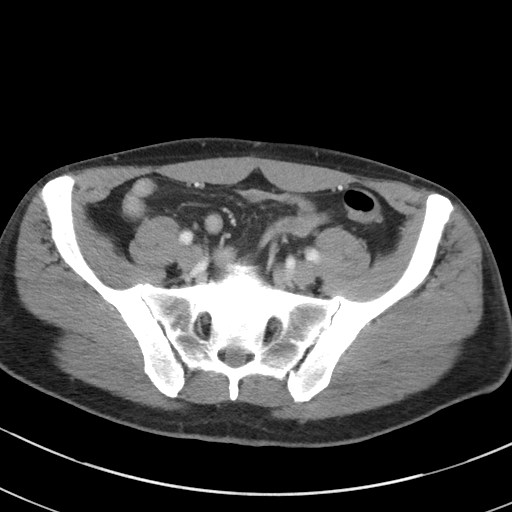
[im 45/107  soft-tissue]
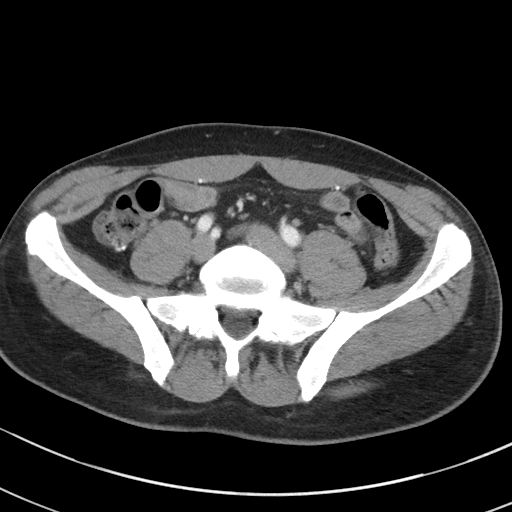
[im 56/107  soft-tissue]
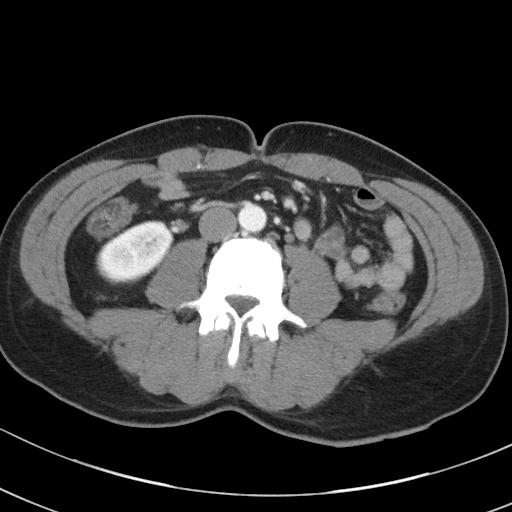
[im 62/107  soft-tissue]
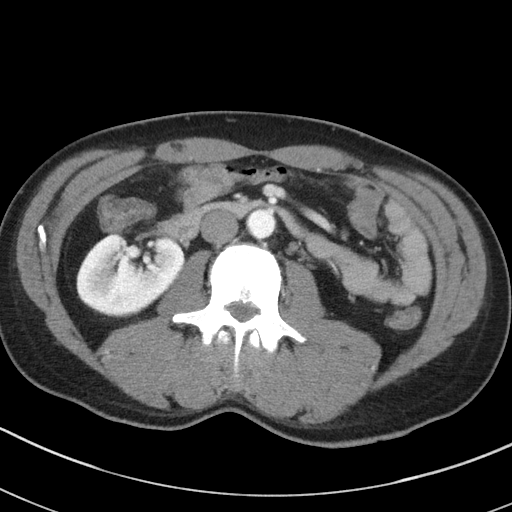
[im 67/107  soft-tissue]
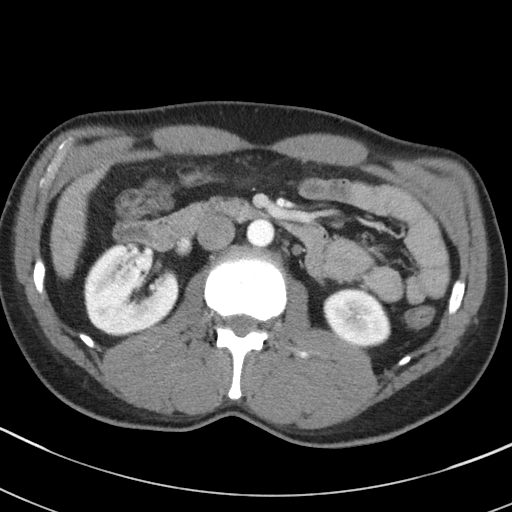
[im 67/107  bone]
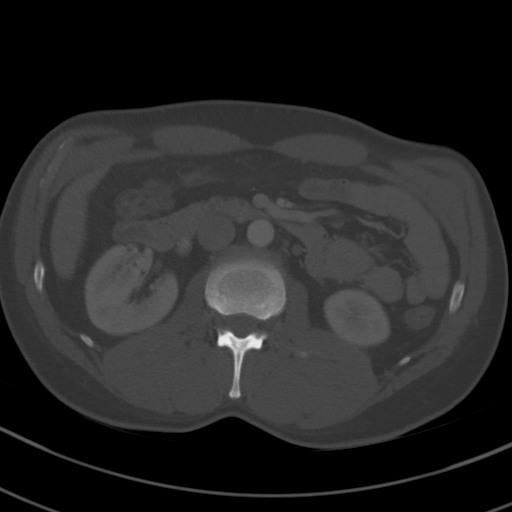
[im 79/107  soft-tissue]
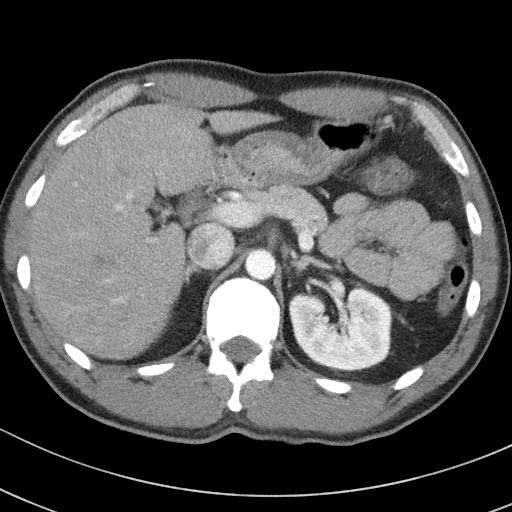
[im 84/107  soft-tissue]
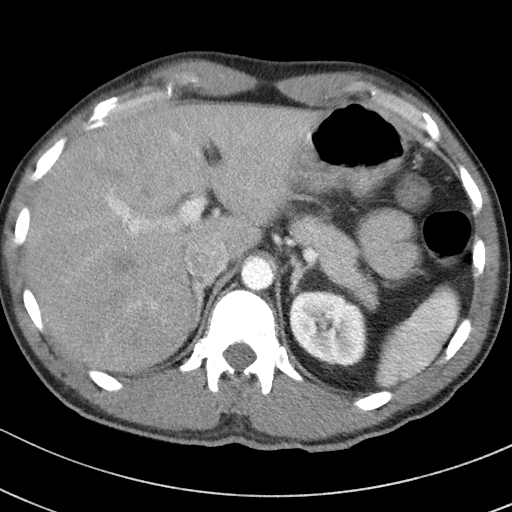
[im 90/107  soft-tissue]
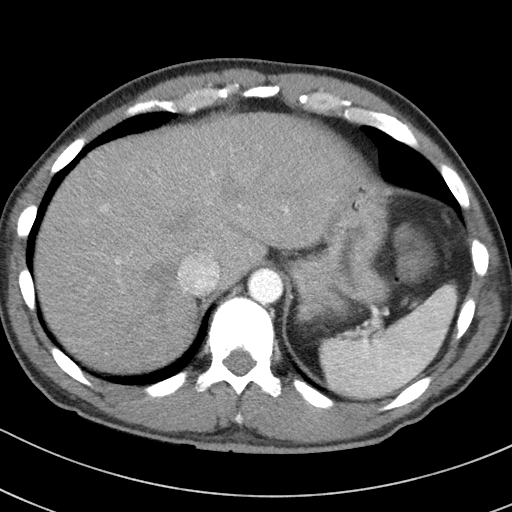
[im 101/107  soft-tissue]
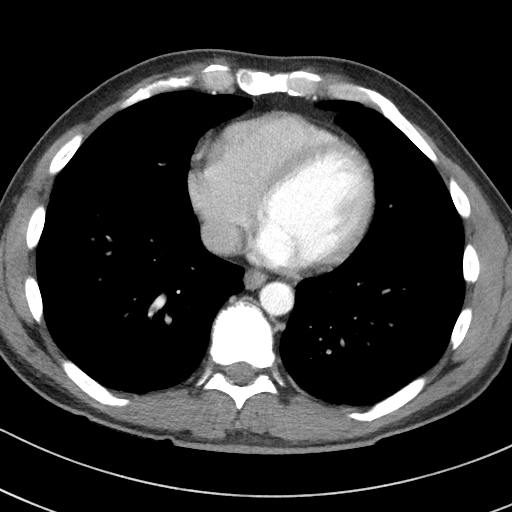

[Series 5: coronal st · coronal · 0.82mm/px · 3 of 93 slices shown]
[im 31/93  soft-tissue]
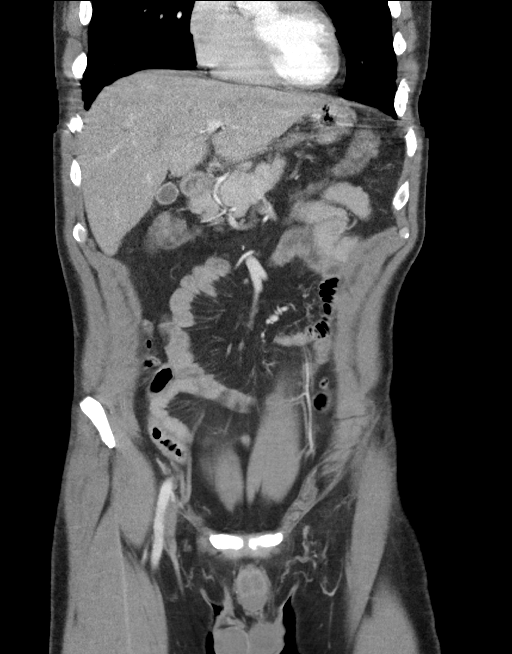
[im 41/93  soft-tissue]
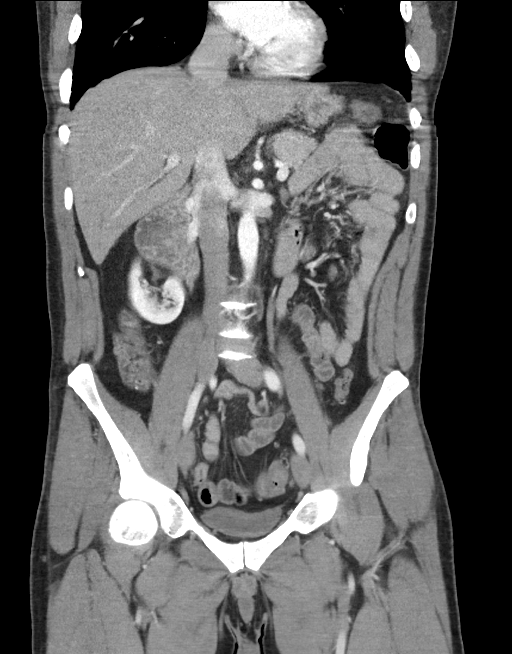
[im 52/93  soft-tissue]
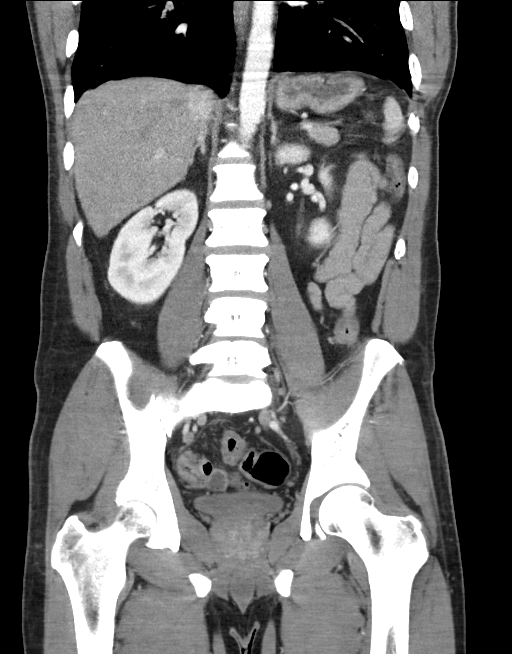

[16 of 46 positions shown; findings below may reference images not displayed]

FINDINGS: Lower chest:  Negative.

Hepatobiliary: No focal liver abnormality.No evidence of biliary
obstruction or stone.

Pancreas: Unremarkable.

Spleen: Unremarkable.

Adrenals/Urinary Tract: Negative adrenals. No hydronephrosis or
stone. Unremarkable bladder.

Stomach/Bowel: No obstruction. No convincing bowel wall thickening
or other inflammatory feature. Mild distal colonic diverticulosis.
Appendectomy.

Vascular/Lymphatic: No acute vascular abnormality. Minimal
atherosclerotic calcification seen along the right common iliac
artery. No mass or adenopathy.

Reproductive:Negative

Other: No ascites or pneumoperitoneum.

Musculoskeletal: No acute or aggressive finding
IMPRESSION: 1. No acute finding.
2. Colonic diverticulosis.
3. Appendectomy.

## 2020-05-18 ENCOUNTER — Ambulatory Visit: Payer: Self-pay | Admitting: Nurse Practitioner
